# Patient Record
Sex: Male | Born: 1985 | Race: White | Hispanic: No | Marital: Single | Smoking: Former smoker
Health system: Southern US, Community
[De-identification: ages and names within clinical notes are randomized; demographics above are authoritative.]

## PROBLEM LIST (undated history)

## (undated) DIAGNOSIS — G4730 Sleep apnea, unspecified: Secondary | ICD-10-CM

## (undated) DIAGNOSIS — S069X9A Unspecified intracranial injury with loss of consciousness of unspecified duration, initial encounter: Secondary | ICD-10-CM

## (undated) DIAGNOSIS — S069XAA Unspecified intracranial injury with loss of consciousness status unknown, initial encounter: Secondary | ICD-10-CM

---

## 2018-11-11 ENCOUNTER — Emergency Department (HOSPITAL_COMMUNITY)
Admission: EM | Admit: 2018-11-11 | Discharge: 2018-11-14 | Disposition: A | Discharge status: Other Acute Inpatient Hospital | Payer: BLUE CROSS/BLUE SHIELD | Attending: Emergency Medicine | Admitting: Emergency Medicine

## 2018-11-11 ENCOUNTER — Other Ambulatory Visit: Payer: Self-pay

## 2018-11-11 DIAGNOSIS — F259 Schizoaffective disorder, unspecified: Secondary | ICD-10-CM | POA: Insufficient documentation

## 2018-11-11 DIAGNOSIS — F250 Schizoaffective disorder, bipolar type: Secondary | ICD-10-CM | POA: Diagnosis present

## 2018-11-11 DIAGNOSIS — Z79899 Other long term (current) drug therapy: Secondary | ICD-10-CM | POA: Insufficient documentation

## 2018-11-11 DIAGNOSIS — Z046 Encounter for general psychiatric examination, requested by authority: Secondary | ICD-10-CM | POA: Diagnosis not present

## 2018-11-11 DIAGNOSIS — F29 Unspecified psychosis not due to a substance or known physiological condition: Secondary | ICD-10-CM | POA: Diagnosis present

## 2018-11-11 HISTORY — DX: Sleep apnea, unspecified: G47.30

## 2018-11-11 HISTORY — DX: Unspecified intracranial injury with loss of consciousness status unknown, initial encounter: S06.9XAA

## 2018-11-11 HISTORY — DX: Unspecified intracranial injury with loss of consciousness of unspecified duration, initial encounter: S06.9X9A

## 2018-11-11 LAB — ACETAMINOPHEN LEVEL: Acetaminophen (Tylenol), Serum: <10 ug/mL — ABNORMAL LOW (ref 10–30)

## 2018-11-11 LAB — COMPREHENSIVE METABOLIC PANEL
ALT: 30 U/L (ref 0–44)
AST: 29 U/L (ref 15–41)
Albumin: 4.7 g/dL (ref 3.5–5.0)
Alkaline Phosphatase: 66 U/L (ref 38–126)
Anion gap: 10 (ref 5–15)
BUN: 9 mg/dL (ref 6–20)
CO2: 23 mmol/L (ref 22–32)
Calcium: 9.6 mg/dL (ref 8.9–10.3)
Chloride: 105 mmol/L (ref 98–111)
Creatinine, Ser: 0.83 mg/dL (ref 0.61–1.24)
GFR calc non Af Amer: >60 mL/min (ref >60)
Glucose, Bld: 114 mg/dL — ABNORMAL HIGH (ref 70–99)
Potassium: 3.5 mmol/L (ref 3.5–5.1)
Sodium: 138 mmol/L (ref 135–145)
Total Bilirubin: 0.5 mg/dL (ref 0.3–1.2)
Total Protein: 7.4 g/dL (ref 6.5–8.1)

## 2018-11-11 LAB — CBC
HCT: 45.6 % (ref 39.0–52.0)
Hemoglobin: 15 g/dL (ref 13.0–17.0)
MCH: 29.5 pg (ref 26.0–34.0)
MCHC: 32.9 g/dL (ref 30.0–36.0)
MCV: 89.6 fL (ref 80.0–100.0)
PLATELETS: 182 10*3/uL (ref 150–400)
RBC: 5.09 MIL/uL (ref 4.22–5.81)
RDW: 13.3 % (ref 11.5–15.5)
WBC: 8.6 10*3/uL (ref 4.0–10.5)
nRBC: 0 % (ref 0.0–0.2)

## 2018-11-11 LAB — ETHANOL: Alcohol, Ethyl (B): <10 mg/dL (ref <10)

## 2018-11-11 LAB — RAPID URINE DRUG SCREEN, HOSP PERFORMED
Amphetamines: NOT DETECTED
Barbiturates: NOT DETECTED
Benzodiazepines: NOT DETECTED
Cocaine: NOT DETECTED
Opiates: NOT DETECTED
Tetrahydrocannabinol: NOT DETECTED

## 2018-11-11 LAB — SALICYLATE LEVEL: Salicylate Lvl: <7 mg/dL (ref 2.8–30.0)

## 2018-11-11 NOTE — ED Notes (Signed)
Spoke with [facility] at [facility] possible placement  Bed available tonight.
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
## 2018-11-11 NOTE — ED Notes (Signed)
Contact numbers for parents Akbar Sparaco [phone]. Several attempts to contact TTS at multiple numbers unsuccessful.

## 2018-11-11 NOTE — Progress Notes (Signed)
Per Donell Sievert, PA pt is recommended for inpt treatment. EDP Fawze, Mina A, PA-C has been advised of the disposition and is in agreement.   [facility] to review for possible admission.  Princess Bruins, MSW, LCSW Therapeutic Triage Specialist  [phone]

## 2018-11-11 NOTE — Progress Notes (Signed)
TTS waiting on IVC to be faxed to [phone] in order to conduct complete assessment. Reita Cliche, RN is aware.  Princess Bruins, MSW, LCSW Therapeutic Triage Specialist  [phone]
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
## 2018-11-12 DIAGNOSIS — F250 Schizoaffective disorder, bipolar type: Secondary | ICD-10-CM

## 2018-11-12 LAB — CBG MONITORING, ED: Glucose-Capillary: 119 mg/dL — ABNORMAL HIGH (ref 70–99)

## 2018-11-12 MED ORDER — CARIPRAZINE HCL 1.5 MG PO CAPS
1.5000 mg | ORAL_CAPSULE | Freq: Every day | ORAL | Status: DC
  Administered 2018-11-12 – 2018-11-14 (×3): 1.5 mg via ORAL
  Filled 2018-11-12 (×3): qty 1

## 2018-11-12 MED ORDER — OXCARBAZEPINE 300 MG PO TABS
600.0000 mg | ORAL_TABLET | Freq: Two times a day (BID) | ORAL | Status: DC
  Administered 2018-11-12 – 2018-11-14 (×4): 600 mg via ORAL
  Filled 2018-11-12 (×4): qty 2

## 2018-11-12 MED ORDER — ARIPIPRAZOLE 10 MG PO TABS: 10.0000 mg | ORAL_TABLET | Freq: Every evening | ORAL | Status: DC

## 2018-11-12 MED ORDER — LURASIDONE HCL 40 MG PO TABS
80.0000 mg | ORAL_TABLET | Freq: Every morning | ORAL | Status: DC
  Administered 2018-11-12: 80 mg via ORAL
  Filled 2018-11-12: qty 2

## 2018-11-12 MED ORDER — OXCARBAZEPINE 300 MG PO TABS
300.0000 mg | ORAL_TABLET | Freq: Two times a day (BID) | ORAL | Status: DC
  Administered 2018-11-12: 300 mg via ORAL
  Filled 2018-11-12 (×2): qty 1

## 2018-11-12 MED ORDER — ONDANSETRON HCL 4 MG PO TABS
4.0000 mg | ORAL_TABLET | Freq: Three times a day (TID) | ORAL | Status: DC | PRN
  Administered 2018-11-14: 4 mg via ORAL
  Filled 2018-11-12: qty 1

## 2018-11-12 MED ORDER — ACETAMINOPHEN 325 MG PO TABS: 650.0000 mg | ORAL_TABLET | ORAL | Status: DC | PRN

## 2018-11-12 NOTE — ED Notes (Signed)
Patient's mother asked to visit.  When patient was asked if he wanted his mother to visit he initially said "no" in a loud voice, but then said that he would like for her to visit.  Patient counseled that no yelling or loud voices would be allowed on the unit and he would have to visit with his mother quietly.  Patient agreed.

## 2018-11-12 NOTE — ED Notes (Signed)
Patient drinking hot decaf coffee so temperature was not obtained at this time.

## 2018-11-12 NOTE — ED Notes (Signed)
Patient up to use the phone. 

## 2018-11-12 NOTE — ED Notes (Signed)
Patient made aware during medication administration that his latuda had been discontinued and a new medication vraylar started.  Patient took medication saying, "I hope this doesn't hurt me."  Reassurance given to patient, then he replied, "that's what they all say."

## 2018-11-12 NOTE — ED Notes (Signed)
Patient alert, oriented, and cooperative taking medications without difficulty.

## 2018-11-12 NOTE — ED Notes (Signed)
Pt refused vital signs.

## 2018-11-12 NOTE — Progress Notes (Signed)
Received Yianni at the change of shift, in his bed and asleep in his room. He was cooperative with VS check and recheck, but remains very irritable. He slept throughout the night without incident.

## 2018-11-12 NOTE — ED Notes (Signed)
Patient cooperative with medication earlier, but refused vital signs when tech tried to take them.  Patient irritable when psychiatric team made their rounds.  "I want to go home."  Nurse practitioner reminded patient that he was IVC'd and would have to be admitted to a facility.  Patient remained physically calm, but became more irritable after that.

## 2018-11-12 NOTE — Progress Notes (Signed)
Received Blessed from TCU,he  went immediately to bed. He denied all of the psychiatric symptoms. He slept throughout the night without incident.
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
## 2018-11-13 ENCOUNTER — Encounter (HOSPITAL_COMMUNITY): Payer: Self-pay | Admitting: *Deleted

## 2018-11-13 DIAGNOSIS — F250 Schizoaffective disorder, bipolar type: Secondary | ICD-10-CM | POA: Diagnosis not present

## 2018-11-13 DIAGNOSIS — S069XAA Unspecified intracranial injury with loss of consciousness status unknown, initial encounter: Secondary | ICD-10-CM | POA: Insufficient documentation

## 2018-11-13 DIAGNOSIS — S069X9A Unspecified intracranial injury with loss of consciousness of unspecified duration, initial encounter: Secondary | ICD-10-CM | POA: Insufficient documentation

## 2018-11-13 NOTE — Progress Notes (Signed)
Admit to [facility] after 9 am to the [employer].  Call report to [phone], accepted by Dr Loyola Mast  Nanine Means, PMHNP

## 2018-11-13 NOTE — ED Notes (Addendum)
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

## 2018-11-13 NOTE — ED Notes (Signed)
On the phone 
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
## 2018-11-13 NOTE — ED Notes (Signed)
Pt is aware of pending transfer tomorrow , contact information given.

## 2018-11-13 NOTE — ED Notes (Signed)
Dr Lenore Cordia and Catha Nottingham DNP into see.  When asked about showering pt responded "no and I'm not going to."

## 2018-11-13 NOTE — Progress Notes (Signed)
Per Psychiatrist Akintayo and DNP Lord, patient meets inpatient psychiatric hospitalization criteria. CSW faxed patient's referral to the following facilities:  [facility]  [facility]   CSW will continue to seek placement for patient.  Celso SickleKimberly Canyon Lohr, LCSW Clinical Social Worker [facility] Cell#: [phone]

## 2018-11-13 NOTE — ED Notes (Signed)
Pt's dad into see 

## 2018-11-13 NOTE — ED Notes (Signed)
Watching tv, eating snack.  Pt confirmed that he does use CPAP at night
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
## 2018-11-13 NOTE — ED Notes (Signed)
Up to the bathroom 

## 2018-11-13 NOTE — ED Notes (Signed)
Pt A&O x 3, no distress noted, calm & cooperative, watching TV at present.  Monitoring for safety, Q 15 min checks in effect.  Pending [facility] after [employer].  Pt is IVC.
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
## 2018-11-14 NOTE — ED Notes (Signed)
Patient is alert and oriented to person, place and time.  Presents with calm affect and mood.  Denies suicidal thoughts, auditory and visual hallucinations.  Medications given as prescribed.  Routine safety checks maintained every 15 minutes and via security camera.  Patient remained safe on the unit.

## 2019-08-08 ENCOUNTER — Emergency Department (HOSPITAL_COMMUNITY): Payer: BC Managed Care – PPO

## 2019-08-08 ENCOUNTER — Encounter (HOSPITAL_COMMUNITY): Payer: Self-pay | Admitting: Emergency Medicine

## 2019-08-08 ENCOUNTER — Emergency Department (HOSPITAL_COMMUNITY)
Admission: EM | Admit: 2019-08-08 | Discharge: 2019-08-09 | Disposition: A | Discharge status: Home / Self Care | Payer: BC Managed Care – PPO | Attending: Emergency Medicine | Admitting: Emergency Medicine

## 2019-08-08 ENCOUNTER — Other Ambulatory Visit: Payer: Self-pay

## 2019-08-08 DIAGNOSIS — Z23 Encounter for immunization: Secondary | ICD-10-CM | POA: Insufficient documentation

## 2019-08-08 DIAGNOSIS — M25512 Pain in left shoulder: Secondary | ICD-10-CM | POA: Insufficient documentation

## 2019-08-08 DIAGNOSIS — Y999 Unspecified external cause status: Secondary | ICD-10-CM | POA: Insufficient documentation

## 2019-08-08 DIAGNOSIS — S0101XA Laceration without foreign body of scalp, initial encounter: Secondary | ICD-10-CM | POA: Diagnosis not present

## 2019-08-08 DIAGNOSIS — Y929 Unspecified place or not applicable: Secondary | ICD-10-CM | POA: Insufficient documentation

## 2019-08-08 DIAGNOSIS — Z79899 Other long term (current) drug therapy: Secondary | ICD-10-CM | POA: Insufficient documentation

## 2019-08-08 DIAGNOSIS — Y9355 Activity, bike riding: Secondary | ICD-10-CM | POA: Insufficient documentation

## 2019-08-08 DIAGNOSIS — S42022A Displaced fracture of shaft of left clavicle, initial encounter for closed fracture: Secondary | ICD-10-CM

## 2019-08-08 MED ORDER — LIDOCAINE-EPINEPHRINE (PF) 2 %-1:200000 IJ SOLN
10.0000 mL | Freq: Once | INTRAMUSCULAR | Status: AC
  Administered 2019-08-08: 10 mL
  Filled 2019-08-08: qty 10

## 2019-08-08 MED ORDER — TETANUS-DIPHTH-ACELL PERTUSSIS 5-2.5-18.5 LF-MCG/0.5 IM SUSP
0.5000 mL | Freq: Once | INTRAMUSCULAR | Status: AC
  Administered 2019-08-08: 0.5 mL via INTRAMUSCULAR
  Filled 2019-08-08: qty 0.5

## 2019-08-08 MED ORDER — HYDROMORPHONE HCL 2 MG PO TABS: 1.0000 mg | ORAL_TABLET | Freq: Four times a day (QID) | ORAL | 0 refills | Status: AC | PRN

## 2019-08-08 MED ORDER — HYDROMORPHONE HCL 2 MG PO TABS
1.0000 mg | ORAL_TABLET | Freq: Once | ORAL | Status: AC
  Administered 2019-08-08: 1 mg via ORAL
  Filled 2019-08-08: qty 1
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
## 2019-08-08 NOTE — ED Notes (Addendum)
AUNT - Daniel Meza PRESENT PT Daniel Meza MOTHER Daniel Meza HAS GUARDIAN/ HCPOA MOTHER  IS THREE HOURS AWAY.  PT HAS AUTISM.
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
## 2019-08-08 NOTE — Discharge Instructions (Addendum)
Recommend return to ER or your primary care doctor in 7 days to have the staples removed.  Please call the orthopedic office to schedule follow-up appointment regarding her clavicle fracture today.  Please use sling as instructed.  Recommend no weightbearing in your left arm.  Please keep the scalp wound clean and dry.  May run gentle water but no scrubbing.  If you develop numbness, weakness, tingling, swelling in your left arm, please return to ER for recheck.  Take the prescribed medication as needed for pain control.  I additionally recommend using Tylenol Motrin for pain control.

## 2019-08-09 ENCOUNTER — Ambulatory Visit: Payer: BC Managed Care – PPO | Admitting: Orthopaedic Surgery

## 2019-08-09 DIAGNOSIS — S0101XD Laceration without foreign body of scalp, subsequent encounter: Secondary | ICD-10-CM

## 2019-08-09 DIAGNOSIS — S42022A Displaced fracture of shaft of left clavicle, initial encounter for closed fracture: Secondary | ICD-10-CM | POA: Diagnosis not present
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
## 2019-08-10 DIAGNOSIS — S0101XA Laceration without foreign body of scalp, initial encounter: Secondary | ICD-10-CM | POA: Insufficient documentation

## 2019-08-10 DIAGNOSIS — S42022A Displaced fracture of shaft of left clavicle, initial encounter for closed fracture: Secondary | ICD-10-CM | POA: Insufficient documentation

## 2019-08-16 ENCOUNTER — Ambulatory Visit (INDEPENDENT_AMBULATORY_CARE_PROVIDER_SITE_OTHER): Payer: BC Managed Care – PPO | Admitting: Orthopaedic Surgery

## 2019-08-16 ENCOUNTER — Telehealth: Payer: Self-pay | Admitting: Orthopaedic Surgery

## 2019-08-16 ENCOUNTER — Other Ambulatory Visit: Payer: Self-pay

## 2019-08-16 DIAGNOSIS — S42022A Displaced fracture of shaft of left clavicle, initial encounter for closed fracture: Secondary | ICD-10-CM | POA: Diagnosis not present

## 2019-08-16 NOTE — Telephone Encounter (Signed)
Patient's mother called this morning stating that the patient's collar bone looks significantly worse and wanted to get in to see Dr. Lorin Mercy today.  CB#[phone].  Thank you.

## 2019-08-16 NOTE — Telephone Encounter (Signed)
I called patient's mother and made work in appt for 1100 today. She will have aunt bring him.

## 2019-08-16 NOTE — Telephone Encounter (Signed)
yates 

## 2019-08-17 MED ORDER — SODIUM CHLORIDE (PF) 0.9 % IJ SOLN
INTRAMUSCULAR | Status: AC
  Filled 2019-08-17: qty 50

## 2019-08-18 NOTE — Progress Notes (Signed)
   Office Visit Note   Patient: Daniel Meza           Date of Birth: 07/10/1986           MRN: [id number] Visit Date: 08/16/2019              Requested by: No referring provider defined for this encounter. PCP: Patient, No Pcp Per   Assessment & Plan: Visit Diagnoses:  1. Displaced fracture of shaft of left clavicle, initial encounter for closed fracture     Plan: Return next week.  Staples removal from his left scalp.  Repeat x-rays of his clavicle, left.  Continued intermittent ice he can use ibuprofen for pain and avoid the narcotic.  Follow-Up Instructions: No follow-ups on file.   Orders:  No orders of the defined types were placed in this encounter.  No orders of the defined types were placed in this encounter.     Procedures: No procedures performed   Clinical Data: No additional findings.   Subjective: Chief Complaint  Patient presents with  . Left Shoulder - Follow-up    HPI 33 year old male returns early for follow-up of left clavicle fracture 1 week out.  Patient is here with his aunt who is with him with his previous visit.  Patient's mother felt that the clavicle looked more prominent she noted that the patient had been removing his sling rather frequently.  Patient has bipolar disorder schizoaffective disorder.  He states he really has not had much pain with it pain down and he has not taken any narcotic medicine in over 24hours.  He still has staples in his scalp which will be removed on next visit.  Patient denies any numbness or tingling in his fingers.  Fracture was closed mid clavicle.  Review of Systems updated and unchanged.   Objective: Vital Signs: There were no vitals taken for this visit.  Physical Exam General physical exam is unchanged other than orthopedic exam see below.  Ortho Exam patient has good cervical range of motion normal station his hand no significant swelling in his fingers good flexion-extension.  He is using his left hand  to work his phone all different objects as well.  Slight ecchymosis anteriorly over the clavicle.  Midshaft clavicle prominence at the fracture site somewhere the last week.  Patient is in a sling.  Sensation over the deltoid laterally is intact median radial ulnar sensation is intact as well as motor.  Specialty Comments:  No specialty comments available.  Imaging: No results found.   PMFS History: Patient Active Problem List   Diagnosis Date Noted  . Scalp laceration 08/10/2019  . Displaced fracture of shaft of left clavicle, initial encounter for closed fracture 08/10/2019  . TBI (traumatic brain injury) ([place])   . Schizoaffective disorder, bipolar type ([place]) 11/12/2018   Past Medical History:  Diagnosis Date  . Sleep apnea    uses CPAP  . TBI (traumatic brain injury) ([place])     No family history on file.  No past surgical history on file. Social History   Occupational History  . Not on file  Tobacco Use  . Smoking status: Not on file  Substance and Sexual Activity  . Alcohol use: Not on file  . Drug use: Not on file  . Sexual activity: Not on file

## 2019-08-23 ENCOUNTER — Ambulatory Visit: Payer: Self-pay

## 2019-08-23 ENCOUNTER — Other Ambulatory Visit: Payer: Self-pay

## 2019-08-23 ENCOUNTER — Ambulatory Visit (INDEPENDENT_AMBULATORY_CARE_PROVIDER_SITE_OTHER): Payer: BC Managed Care – PPO | Admitting: Orthopaedic Surgery

## 2019-08-23 DIAGNOSIS — S42022A Displaced fracture of shaft of left clavicle, initial encounter for closed fracture: Secondary | ICD-10-CM | POA: Diagnosis not present

## 2019-08-23 NOTE — Progress Notes (Signed)
Office Visit Note   Patient: Daniel Meza           Date of Birth: 21-Sep-1986           MRN: [id number] Visit Date: 08/23/2019              Requested by: No referring provider defined for this encounter. PCP: Patient, No Pcp Per   Assessment & Plan: Visit Diagnoses:  1. Displaced fracture of shaft of left clavicle, initial encounter for closed fracture     Plan: Patient taking Tylenol for pain.  A sleepy today frequently closing his eyes.  Mother is with him today.  I will recheck him in 4 weeks repeat x-rays at that time left clavicle.  If patient is not starting to make some bone we could consider open reduction internal fixation and plate fixation.    Staples left scalp are removed today.  Follow-Up Instructions: Return in about 4 weeks (around 09/20/2019).   Orders:  Orders Placed This Encounter  Procedures  . XR Clavicle Left   No orders of the defined types were placed in this encounter.     Procedures: No procedures performed   Clinical Data: No additional findings.   Subjective: Chief Complaint  Patient presents with  . Left Shoulder - Fracture, Follow-up    Left Clavicle Fx- DOI 08/08/2019    HPI 33 year old male returns he is here with his mother.  He is sleepy today frequently closing his eyes his mother states she thinks it may be related to narcolepsy.  Patient's is using Tylenol for pain not taking any narcotic medication and states his clavicle fracture gives him minimal discomfort.  He has been wearing a sling.  No swelling of the hand good motion of his fingers.  Scalp staples are still in left side of the head without problems.  Review of Systems positive for diagnosis schizoaffective disorder bipolar type.  He denies hearing voices or hallucinations.   Objective: Vital Signs: There were no vitals taken for this visit.  Physical Exam Constitutional:      Appearance: He is well-developed.  HENT:     Head: Normocephalic and atraumatic.  Eyes:      Pupils: Pupils are equal, round, and reactive to light.  Neck:     Thyroid: No thyromegaly.     Trachea: No tracheal deviation.  Cardiovascular:     Rate and Rhythm: Normal rate.  Pulmonary:     Effort: Pulmonary effort is normal.     Breath sounds: No wheezing.  Abdominal:     General: Bowel sounds are normal.     Palpations: Abdomen is soft.  Skin:    General: Skin is warm and dry.     Capillary Refill: Capillary refill takes less than 2 seconds.  Neurological:     Mental Status: He is alert and oriented to person, place, and time.  Psychiatric:        Behavior: Behavior normal.        Thought Content: Thought content normal.        Judgment: Judgment normal.     Ortho Exam sensation intact in the hand.  Patient is wearing a sling appropriately.  Specialty Comments:  No specialty comments available.  Imaging: No results found.   PMFS History: Patient Active Problem List   Diagnosis Date Noted  . Scalp laceration 08/10/2019  . Displaced fracture of shaft of left clavicle, initial encounter for closed fracture 08/10/2019  . TBI (traumatic brain injury) (HCC)   .  Schizoaffective disorder, bipolar type ([place]) 11/12/2018   Past Medical History:  Diagnosis Date  . Sleep apnea    uses CPAP  . TBI (traumatic brain injury) ([place])     No family history on file.  No past surgical history on file. Social History   Occupational History  . Not on file  Tobacco Use  . Smoking status: Not on file  Substance and Sexual Activity  . Alcohol use: Not on file  . Drug use: Not on file  . Sexual activity: Not on file

## 2019-09-26 ENCOUNTER — Ambulatory Visit: Payer: Self-pay

## 2019-09-26 ENCOUNTER — Other Ambulatory Visit: Payer: Self-pay

## 2019-09-26 ENCOUNTER — Ambulatory Visit (INDEPENDENT_AMBULATORY_CARE_PROVIDER_SITE_OTHER): Payer: BC Managed Care – PPO | Admitting: Orthopaedic Surgery

## 2019-09-26 DIAGNOSIS — S42022A Displaced fracture of shaft of left clavicle, initial encounter for closed fracture: Secondary | ICD-10-CM

## 2019-09-26 NOTE — Progress Notes (Signed)
Office Visit Note   Patient: Daniel Meza           Date of Birth: 1986/05/18           MRN: [id number] Visit Date: 09/26/2019              Requested by: No referring provider defined for this encounter. PCP: Patient, No Pcp Per   Assessment & Plan: Visit Diagnoses:  1. Displaced fracture of shaft of left clavicle, initial encounter for closed fracture     Plan: We discussed avoiding heavy pulling or pushing with the left upper extremity.  Return in 8 weeks for final x-rays of the clavicle.  Follow-Up Instructions: Return in about 8 weeks (around 11/21/2019).   Orders:  Orders Placed This Encounter  Procedures  . XR Clavicle Left   No orders of the defined types were placed in this encounter.     Procedures: No procedures performed   Clinical Data: No additional findings.   Subjective: Chief Complaint  Patient presents with  . Left Shoulder - Fracture, Follow-up    DOI 08/08/2019    HPI 33 year old male returns he is here with his mother for follow-up of left midshaft clavicle fracture date of injury 08/08/2019.  Patient states is not giving him any pain he can get his arm up over his head no problems with dressing or bathing.  He states he still has some pain in his upper body since the accident but not at the clavicle.  Past history of TBI.  Patient states mother wanted him to get a second opinion he went to [facility] and patient states they told him the same thing and discussed the same things that we had discussed with him.  Review of Systems updated unchanged noncontributory to HPI.   Objective: Vital Signs: There were no vitals taken for this visit.  Physical Exam Constitutional:      Appearance: He is well-developed.  HENT:     Head: Normocephalic and atraumatic.  Eyes:     Pupils: Pupils are equal, round, and reactive to light.  Neck:     Thyroid: No thyromegaly.     Trachea: No tracheal deviation.  Cardiovascular:     Rate and Rhythm: Normal rate.    Pulmonary:     Effort: Pulmonary effort is normal.     Breath sounds: No wheezing.  Abdominal:     General: Bowel sounds are normal.     Palpations: Abdomen is soft.  Skin:    General: Skin is warm and dry.     Capillary Refill: Capillary refill takes less than 2 seconds.  Neurological:     Mental Status: He is alert and oriented to person, place, and time.  Psychiatric:        Behavior: Behavior normal.        Thought Content: Thought content normal.        Judgment: Judgment normal.     Ortho Exam no swelling to the hand.  There is some prominence with bump at the midshaft clavicle region on the left.  With pressure on each end of the bone it appears to move in 1unit.  Skin is intact.  Specialty Comments:  No specialty comments available.  Imaging: No results found.   PMFS History: Patient Active Problem List   Diagnosis Date Noted  . Scalp laceration 08/10/2019  . Displaced fracture of shaft of left clavicle, initial encounter for closed fracture 08/10/2019  . TBI (traumatic brain injury) (HCC)   .  Schizoaffective disorder, bipolar type ([place]) 11/12/2018   Past Medical History:  Diagnosis Date  . Sleep apnea    uses CPAP  . TBI (traumatic brain injury) ([place])     No family history on file.  No past surgical history on file. Social History   Occupational History  . Not on file  Tobacco Use  . Smoking status: Not on file  Substance and Sexual Activity  . Alcohol use: Not on file  . Drug use: Not on file  . Sexual activity: Not on file

## 2019-11-08 ENCOUNTER — Emergency Department (HOSPITAL_COMMUNITY)
Admission: EM | Admit: 2019-11-08 | Discharge: 2019-11-09 | Disposition: A | Discharge status: Home / Self Care | Payer: BC Managed Care – PPO | Attending: Emergency Medicine | Admitting: Emergency Medicine

## 2019-11-08 ENCOUNTER — Other Ambulatory Visit: Payer: Self-pay

## 2019-11-08 ENCOUNTER — Encounter (HOSPITAL_COMMUNITY): Payer: Self-pay | Admitting: Behavioral Health

## 2019-11-08 DIAGNOSIS — F250 Schizoaffective disorder, bipolar type: Secondary | ICD-10-CM | POA: Diagnosis present

## 2019-11-08 DIAGNOSIS — Z046 Encounter for general psychiatric examination, requested by authority: Secondary | ICD-10-CM | POA: Diagnosis present

## 2019-11-08 DIAGNOSIS — F840 Autistic disorder: Secondary | ICD-10-CM | POA: Insufficient documentation

## 2019-11-08 DIAGNOSIS — Z87820 Personal history of traumatic brain injury: Secondary | ICD-10-CM | POA: Insufficient documentation

## 2019-11-08 DIAGNOSIS — F259 Schizoaffective disorder, unspecified: Secondary | ICD-10-CM | POA: Insufficient documentation

## 2019-11-08 DIAGNOSIS — Z5320 Procedure and treatment not carried out because of patient's decision for unspecified reasons: Secondary | ICD-10-CM | POA: Diagnosis not present

## 2019-11-08 DIAGNOSIS — Z79899 Other long term (current) drug therapy: Secondary | ICD-10-CM | POA: Insufficient documentation

## 2019-11-08 DIAGNOSIS — Z20822 Contact with and (suspected) exposure to covid-19: Secondary | ICD-10-CM | POA: Diagnosis not present

## 2019-11-08 LAB — CBC WITH DIFFERENTIAL/PLATELET
Abs Immature Granulocytes: 0.07 10*3/uL (ref 0.00–0.07)
Basophils Absolute: 0.2 10*3/uL — ABNORMAL HIGH (ref 0.0–0.1)
Basophils Relative: 1 %
Eosinophils Absolute: 0.5 10*3/uL (ref 0.0–0.5)
Eosinophils Relative: 4 %
HCT: 48 % (ref 39.0–52.0)
Hemoglobin: 16.1 g/dL (ref 13.0–17.0)
Immature Granulocytes: 1 %
Lymphocytes Relative: 24 %
Lymphs Abs: 2.6 10*3/uL (ref 0.7–4.0)
MCH: 29.5 pg (ref 26.0–34.0)
MCHC: 33.5 g/dL (ref 30.0–36.0)
MCV: 87.9 fL (ref 80.0–100.0)
Monocytes Absolute: 0.8 10*3/uL (ref 0.1–1.0)
Monocytes Relative: 7 %
Neutro Abs: 6.7 10*3/uL (ref 1.7–7.7)
Neutrophils Relative %: 63 %
Platelets: 234 10*3/uL (ref 150–400)
RBC: 5.46 MIL/uL (ref 4.22–5.81)
RDW: 12.8 % (ref 11.5–15.5)
WBC: 10.8 10*3/uL — ABNORMAL HIGH (ref 4.0–10.5)
nRBC: 0 % (ref 0.0–0.2)

## 2019-11-08 LAB — RESPIRATORY PANEL BY RT PCR (FLU A&B, COVID)
Influenza A by PCR: NEGATIVE
Influenza B by PCR: NEGATIVE
SARS Coronavirus 2 by RT PCR: NEGATIVE

## 2019-11-08 LAB — COMPREHENSIVE METABOLIC PANEL
ALT: 52 U/L — ABNORMAL HIGH (ref 0–44)
AST: 42 U/L — ABNORMAL HIGH (ref 15–41)
Albumin: 4.9 g/dL (ref 3.5–5.0)
Alkaline Phosphatase: 86 U/L (ref 38–126)
Anion gap: 11 (ref 5–15)
BUN: 10 mg/dL (ref 6–20)
CO2: 26 mmol/L (ref 22–32)
Calcium: 10 mg/dL (ref 8.9–10.3)
Chloride: 98 mmol/L (ref 98–111)
Creatinine, Ser: 0.71 mg/dL (ref 0.61–1.24)
GFR calc Af Amer: >60 mL/min (ref >60)
GFR calc non Af Amer: >60 mL/min (ref >60)
Glucose, Bld: 107 mg/dL — ABNORMAL HIGH (ref 70–99)
Potassium: 4.2 mmol/L (ref 3.5–5.1)
Sodium: 135 mmol/L (ref 135–145)
Total Bilirubin: 0.5 mg/dL (ref 0.3–1.2)
Total Protein: 8 g/dL (ref 6.5–8.1)

## 2019-11-08 LAB — RAPID URINE DRUG SCREEN, HOSP PERFORMED
Amphetamines: NOT DETECTED
Barbiturates: NOT DETECTED
Benzodiazepines: NOT DETECTED
Cocaine: NOT DETECTED
Opiates: NOT DETECTED
Tetrahydrocannabinol: NOT DETECTED

## 2019-11-08 LAB — ETHANOL: Alcohol, Ethyl (B): <10 mg/dL (ref <10)

## 2019-11-08 MED ORDER — HYDROXYZINE HCL 25 MG PO TABS: 50.0000 mg | ORAL_TABLET | Freq: Four times a day (QID) | ORAL | Status: DC | PRN

## 2019-11-08 MED ORDER — LORAZEPAM 2 MG/ML IJ SOLN: 1.0000 mg | Freq: Four times a day (QID) | INTRAMUSCULAR | Status: DC | PRN

## 2019-11-08 NOTE — ED Triage Notes (Signed)
IVC states patient is having psychosis and off meds.   IVC paperwork states that he is having S/I and H/I.  Patient is however denying all of these things.  He also denies AVH and states he is taking his medication.
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
## 2019-11-08 NOTE — Progress Notes (Signed)
Received Oak this PM in his room asleep with the sitter at the bedside.He was awaken for VS and very irritable. He denied the need for a CPAP machine to aid his sleep. He slept without incident throughout the night.

## 2019-11-08 NOTE — BH Assessment (Addendum)
[facility] Assessment Progress Note  Per Shuvon Rankin, FNP, this pt requires psychiatric hospitalization at this time.  Pt presents under IVC initiated by pt's father/legal guardian and upheld by EDP Benjiman Core, MD.  Pt is currently under consideration for admission at [facility] with decision pending.  In the meantime, the following facilities have been contacted to seek placement for this pt, with results as noted:  Beds available, information sent, decision pending: [facility]  Declined: [facility] (autism diagnosis is exclusionary)  At capacity: [facility]  Pt is under the guardianship of his parents, Genevie Cheshire and Kou Gucciardo [phone]).  A copy of the letter of guardianship may be found in pt's EPIC record under the Media tab.  At 16:13 I called them and informed them of pt's current disposition. I also informed them that pt had been declined by [facility], the first choice of both them and pt's outpatient provider, Tamela Oddi.  I informed them that [facility] staff will keep them informed of pt's disposition and that a copy of the letter of guardianship will follow the pt to any facility that agrees to accept him.  They report that pt has been refusing to take medications.  They and Tamela Oddi have reportedly been considering starting pt on an injectable medication, but pt has so far refused to cooperate with this also.  Please note that the parents have been appointed as limited general guardians.  Refer to the letter of guardianship to ascertain rights retained by the pt versus those granted to the parents.   Doylene Canning, [place] [facility] Coordinator [phone]
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
## 2019-11-09 NOTE — Consult Note (Signed)
Telepsych Consultation   Reason for Consult: Delusional and aggressive behavior  Referring Physician:  Wonda Olds EDP Location of Patient:  Location of Provider: [facility]  Patient Identification: Gregrey Bloyd MRN:  [id number] Principal Diagnosis: Schizoaffective disorder, bipolar type (HCC) Diagnosis:  Principal Problem:   Schizoaffective disorder, bipolar type (HCC)   Total Time spent with patient: 30 minutes  Subjective:   Kishaun Erekson is a 34 y.o. male patient.  Patient assessed by nurse practitioner.  Patient alert and oriented.  Patient states "my parents have a problem with me being on this planet, they follow me around, I wanted a therapist but they gave me a psychiatrist who gave me a bunch of pills that do not work in my body correctly." Patient appears irritable and labile, patient states "I feel like my parents are trying to sell me to some Corporation, they are illegal drug dealers, I used to be there sleep in their home, they have medical power of attorney." Patient visualized with physically aggressive behavior when notified he would need to remain in hospital for a few days.  Patient visualized shoving telepsychiatry machine toward nurse.  HPI: Patient admitted under involuntary commitment, petition states patient has increased delusions and threatening family members.  Past Psychiatric History: Schizoaffective disorder, bipolar type. TBI  Risk to Self: Suicidal Ideation: No Suicidal Intent: No Is patient at risk for suicide?: No Suicidal Plan?: No Access to Means: No What has been your use of drugs/alcohol within the last 12 months?: Alcohol Risk to Others: Homicidal Ideation: No Thoughts of Harm to Others: Yes-Currently Present Comment - Thoughts of Harm to Others: Pt told parents of desire to harm them Current Homicidal Intent: No Current Homicidal Plan: No Access to Homicidal Means: No Identified Victim: Father, grandfather, mother History of  harm to others?: No Assessment of Violence: None Noted Does patient have access to weapons?: No Criminal Charges Pending?: No Does patient have a court date: No Prior Inpatient Therapy: Prior Inpatient Therapy: Yes Prior Therapy Dates: Feb 2020 and other Prior Therapy Facilty/Provider(s): [facility] and other Reason for Treatment: Schizoaffective Disorder Prior Outpatient Therapy: Prior Outpatient Therapy: Yes Prior Therapy Dates: Ongoing Prior Therapy Facilty/Provider(s): [facility] Reason for Treatment: Schizoaffective D/O Does patient have an ACCT team?: No Does patient have Intensive In-House Services?  : No Does patient have [facility] services? : No Does patient have P4CC services?: No  Past Medical History:  Past Medical History:  Diagnosis Date  . Sleep apnea    uses CPAP  . TBI (traumatic brain injury) (HCC)    No past surgical history on file. Family History: No family history on file. Family Psychiatric  History: Unknown Social History:  Social History   Substance and Sexual Activity  Alcohol Use Yes   Comment: Not sure     Social History   Substance and Sexual Activity  Drug Use Not on file    Social History   Socioeconomic History  . Marital status: Single    Spouse name: Not on file  . Number of children: Not on file  . Years of education: Not on file  . Highest education level: Not on file  Occupational History  . Occupation: Disability  Tobacco Use  . Smoking status: Not on file  Substance and Sexual Activity  . Alcohol use: Yes    Comment: Not sure  . Drug use: Not on file  . Sexual activity: Never  Other Topics Concern  . Not on file  Social History Narrative  Pt lives in a home owned by his parents, [place] and [place].  Pt receives outpatient treatment with Tamela Oddi at [facility].  He is on disability.   Social Determinants of Health   Financial Resource Strain:   . Difficulty of Paying Living Expenses: Not on file  Food Insecurity:    . Worried About Programme researcher, broadcasting/film/video in the Last Year: Not on file  . Ran Out of Food in the Last Year: Not on file  Transportation Needs:   . Lack of Transportation (Medical): Not on file  . Lack of Transportation (Non-Medical): Not on file  Physical Activity:   . Days of Exercise per Week: Not on file  . Minutes of Exercise per Session: Not on file  Stress:   . Feeling of Stress : Not on file  Social Connections:   . Frequency of Communication with Friends and Family: Not on file  . Frequency of Social Gatherings with Friends and Family: Not on file  . Attends Religious Services: Not on file  . Active Member of Clubs or Organizations: Not on file  . Attends Banker Meetings: Not on file  . Marital Status: Not on file   Additional Social History:    Allergies:   Allergies  Allergen Reactions  . Geodon [Ziprasidone Hcl] Other (See Comments)    Makes comatose  . Penicillins Anaphylaxis and Rash    Did it involve swelling of the face/tongue/throat, SOB, or low BP? Yes Did it involve sudden or severe rash/hives, skin peeling, or any reaction on the inside of your mouth or nose? Yes Did you need to seek medical attention at a hospital or doctor's office? Yes When did it last happen?prior to 2010 If all above answers are "NO", may proceed with cephalosporin use.   . Haldol [Haloperidol Lactate] Other (See Comments)    dyskinesia  . Latuda [Lurasidone] Nausea And Vomiting  . Percocet [Oxycodone-Acetaminophen] Other (See Comments)    Dizzy, hallucinations  . Risperidone And Related Nausea And Vomiting    Labs:  Results for orders placed or performed during the hospital encounter of 11/08/19 (from the past 48 hour(s))  Urine rapid drug screen (hosp performed)     Status: None   Collection Time: 11/08/19 11:44 AM  Result Value Ref Range   Opiates NONE DETECTED NONE DETECTED   Cocaine NONE DETECTED NONE DETECTED   Benzodiazepines NONE DETECTED NONE DETECTED    Amphetamines NONE DETECTED NONE DETECTED   Tetrahydrocannabinol NONE DETECTED NONE DETECTED   Barbiturates NONE DETECTED NONE DETECTED    Comment: (NOTE) DRUG SCREEN FOR MEDICAL PURPOSES ONLY.  IF CONFIRMATION IS NEEDED FOR ANY PURPOSE, NOTIFY LAB WITHIN 5 DAYS. LOWEST DETECTABLE LIMITS FOR URINE DRUG SCREEN Drug Class                     Cutoff (ng/mL) Amphetamine and metabolites    1000 Barbiturate and metabolites    200 Benzodiazepine                 200 Tricyclics and metabolites     300 Opiates and metabolites        300 Cocaine and metabolites        300 THC                            50 Performed at [facility], [address] [place], [place] [address]   Comprehensive  metabolic panel     Status: Abnormal   Collection Time: 11/08/19 12:02 PM  Result Value Ref Range   Sodium 135 135 - 145 mmol/L   Potassium 4.2 3.5 - 5.1 mmol/L   Chloride 98 98 - 111 mmol/L   CO2 26 22 - 32 mmol/L   Glucose, Bld 107 (H) 70 - 99 mg/dL   BUN 10 6 - 20 mg/dL   Creatinine, Ser 0.71 0.61 - 1.24 mg/dL   Calcium 10.0 8.9 - 10.3 mg/dL   Total Protein 8.0 6.5 - 8.1 g/dL   Albumin 4.9 3.5 - 5.0 g/dL   AST 42 (H) 15 - 41 U/L   ALT 52 (H) 0 - 44 U/L   Alkaline Phosphatase 86 38 - 126 U/L   Total Bilirubin 0.5 0.3 - 1.2 mg/dL   GFR calc non Af Amer >60 >60 mL/min   GFR calc Af Amer >60 >60 mL/min   Anion gap 11 5 - 15    Comment: Performed at [facility], [place] [address] [place], [place] [address]  Ethanol     Status: None   Collection Time: 11/08/19 12:02 PM  Result Value Ref Range   Alcohol, Ethyl (B) <10 <10 mg/dL    Comment: (NOTE) Lowest detectable limit for serum alcohol is 10 mg/dL. For medical purposes only. Performed at [facility], [place] [address] [place], [place] [address]   CBC with Diff     Status: Abnormal   Collection Time: 11/08/19 12:02 PM  Result Value Ref Range   WBC 10.8 (H) 4.0 - 10.5 K/uL   RBC 5.46  4.22 - 5.81 MIL/uL   Hemoglobin 16.1 13.0 - 17.0 g/dL   HCT 48.0 39.0 - 52.0 %   MCV 87.9 80.0 - 100.0 fL   MCH 29.5 26.0 - 34.0 pg   MCHC 33.5 30.0 - 36.0 g/dL   RDW 12.8 11.5 - 15.5 %   Platelets 234 150 - 400 K/uL   nRBC 0.0 0.0 - 0.2 %   Neutrophils Relative % 63 %   Neutro Abs 6.7 1.7 - 7.7 K/uL   Lymphocytes Relative 24 %   Lymphs Abs 2.6 0.7 - 4.0 K/uL   Monocytes Relative 7 %   Monocytes Absolute 0.8 0.1 - 1.0 K/uL   Eosinophils Relative 4 %   Eosinophils Absolute 0.5 0.0 - 0.5 K/uL   Basophils Relative 1 %   Basophils Absolute 0.2 (H) 0.0 - 0.1 K/uL   Immature Granulocytes 1 %   Abs Immature Granulocytes 0.07 0.00 - 0.07 K/uL    Comment: Performed at [facility], [place] [address], [place], [place] [address]  Respiratory Panel by RT PCR (Flu A&B, Covid) - Nasopharyngeal Swab     Status: None   Collection Time: 11/08/19  1:30 PM   Specimen: Nasopharyngeal Swab  Result Value Ref Range   SARS Coronavirus 2 by RT PCR NEGATIVE NEGATIVE    Comment: (NOTE) SARS-CoV-2 target nucleic acids are NOT DETECTED. The SARS-CoV-2 RNA is generally detectable in upper respiratoy specimens during the acute phase of infection. The lowest concentration of SARS-CoV-2 viral copies this assay can detect is 131 copies/mL. A negative result does not preclude SARS-Cov-2 infection and should not be used as the sole basis for treatment or other patient management decisions. A negative result may occur with  improper specimen collection/handling, submission of specimen other than nasopharyngeal swab, presence of viral mutation(s) within the areas targeted by this assay, and inadequate  number of viral copies (<131 copies/mL). A negative result must be combined with clinical observations, patient history, and epidemiological information. The expected result is Negative. Fact Sheet for Patients:  https://www.moore.com/ Fact Sheet for Healthcare Providers:   https://www.young.biz/ This test is not yet ap proved or cleared by the [place] FDA and  has been authorized for detection and/or diagnosis of SARS-CoV-2 by FDA under an Emergency Use Authorization (EUA). This EUA will remain  in effect (meaning this test can be used) for the duration of the COVID-19 declaration under Section 564(b)(1) of the Act, 21 U.S.C. section 360bbb-3(b)(1), unless the authorization is terminated or revoked sooner.    Influenza A by PCR NEGATIVE NEGATIVE   Influenza B by PCR NEGATIVE NEGATIVE    Comment: (NOTE) The Xpert Xpress SARS-CoV-2/FLU/RSV assay is intended as an aid in  the diagnosis of influenza from Nasopharyngeal swab specimens and  should not be used as a sole basis for treatment. Nasal washings and  aspirates are unacceptable for Xpert Xpress SARS-CoV-2/FLU/RSV  testing. Fact Sheet for Patients: https://www.moore.com/ Fact Sheet for Healthcare Providers: https://www.young.biz/ This test is not yet approved or cleared by the [place] FDA and  has been authorized for detection and/or diagnosis of SARS-CoV-2 by  FDA under an Emergency Use Authorization (EUA). This EUA will remain  in effect (meaning this test can be used) for the duration of the  Covid-19 declaration under Section 564(b)(1) of the Act, 21  U.S.C. section 360bbb-3(b)(1), unless the authorization is  terminated or revoked. Performed at [facility], [address], [place], [place] [address]     Medications:  Current Facility-Administered Medications  Medication Dose Route Frequency Provider Last Rate Last Admin  . hydrOXYzine (ATARAX/VISTARIL) tablet 50 mg  50 mg Oral Q6H PRN Kendrick, Caitlyn S, PA-C      . LORazepam (ATIVAN) injection 1 mg  1 mg Intramuscular Q6H PRN Kendrick, Caitlyn S, PA-C       Current Outpatient Medications  Medication Sig Dispense Refill  . ARIPiprazole (ABILIFY) 10  MG tablet Take 10 mg by mouth every evening.    Marland Kitchen DM-Phenylephrine-Acetaminophen (VICKS DAYQUIL COLD & FLU) 10-5-325 MG/15ML LIQD Take 15 mLs by mouth as needed (congestion).    . fluPHENAZine (PROLIXIN) 5 MG tablet Take 5 mg by mouth 2 (two) times daily.    . Oxcarbazepine (TRILEPTAL) 300 MG tablet Take 300 mg by mouth 2 (two) times daily.      Musculoskeletal: Strength & Muscle Tone: within normal limits Gait & Station: normal Patient leans: N/A  Psychiatric Specialty Exam: Physical Exam  Nursing note and vitals reviewed. Constitutional: He is oriented to person, place, and time. He appears well-developed.  HENT:  Head: Normocephalic.  Cardiovascular: Normal rate.  Respiratory: Effort normal.  Neurological: He is alert and oriented to person, place, and time.  Psychiatric: His mood appears anxious. His affect is angry and labile. His speech is rapid and/or pressured and tangential. He is agitated and aggressive. Thought content is paranoid and delusional. Cognition and memory are impaired. He expresses inappropriate judgment.    Review of Systems  Constitutional: Negative.   HENT: Negative.   Eyes: Negative.   Respiratory: Negative.   Cardiovascular: Negative.   Gastrointestinal: Negative.   Genitourinary: Negative.   Musculoskeletal: Negative.   Skin: Negative.   Neurological: Negative.   Psychiatric/Behavioral: Positive for agitation, behavioral problems and hallucinations.    Blood pressure 123/80, pulse 73, temperature 97.8 F (36.6 C), temperature source Oral, resp. rate 18,  height 6' 3.5" (1.918 m), weight (!) 139.7 kg, SpO2 93 %.Body mass index is 37.99 kg/m.  General Appearance: Casual and Fairly Groomed  Eye Contact:  Good  Speech:  Pressured  Volume:  Increased  Mood:  Anxious and Irritable  Affect:  Labile  Thought Process:  Coherent, Goal Directed and Descriptions of Associations: Tangential  Orientation:  Full (Time, Place, and Person)  Thought Content:   Delusions, Paranoid Ideation and Tangential  Suicidal Thoughts:  No  Homicidal Thoughts:  Yes.  without intent/plan  Memory:  Immediate;   Good Recent;   Good Remote;   Good  Judgement:  Impaired  Insight:  Lacking  Psychomotor Activity:  Normal  Concentration:  Concentration: Poor and Attention Span: Poor  Recall:  Good  Fund of Knowledge:  Good  Language:  Good  Akathisia:  No  Handed:  Right  AIMS (if indicated):     Assets:  Communication Skills Desire for Improvement Financial Resources/Insurance Housing Intimacy Leisure Time Physical Health Resilience Social Support  ADL's:  Intact  Cognition:  WNL  Sleep:        Treatment Plan Summary: Daily contact with patient to assess and evaluate symptoms and progress in treatment  Disposition: Recommend psychiatric Inpatient admission when medically cleared. Supportive therapy provided about ongoing stressors. Discussed crisis plan, support from social network, calling 911, coming to the Emergency Department, and calling Suicide Hotline.  This service was provided via telemedicine using a 2-way, interactive audio and video technology.  Names of all persons participating in this telemedicine service and their role in this encounter. Name: Kieth Hartis Role: Patient  Name: Berneice Heinrich Role: FNP    Patrcia Dolly, FNP 11/09/2019 1:08 PM

## 2019-11-09 NOTE — ED Notes (Signed)
Pt calm, cooperative, no s/s of distress.  

## 2019-11-09 NOTE — BH Assessment (Addendum)
BHH Assessment Progress Note  As of this morning, this writer continued to seek placement for this pt.  At 13:44 I received a call from [place] at [facility].  She reports that pt has been accepted to their facility by Dr Loyola Mast to the [place] campus.  This is contingent upon pt arriving with his CPAP machine.  She requests that report be called to [phone].  At 13:46 I called pt's parents/legal guardians.  They report that pt had been admitted [facility] in the past, and they were displeased with the outcome.  They would prefer for pt to be released from IVC and discharged to the residence where he lives independently with home health support.  They report that they have made arrangements for enhanced home health on a 24/7 basis.  This was staffed with Nelly Rout, MD.  She agrees to the parents' terms, and has released pt from IVC.  Pt is to be discharged from [facility] with recommendation to continue treatment with Tamela Oddi, PA at [facility].  This has been included in pt's discharge instructions.  At 14:36 I called back to pt's parents to notify them of final disposition.  They report that pt should have the material means to arrange for transportation home via a ride sharing service.  At 15:18 I called back to [facility], notifying them that pt no longer needs their bed.  Pt's nurse, Waynetta Sandy, has been notified.  Doylene Canning, [place] Behavioral Health Coordinator [phone]   Addendum:  Pt has signed Consent to Release Information to Tamela Oddi, PA, pt's outpatient provider.  At 15:57 I called [facility].  Call rolled to voice mail and I left a notification message.  Doylene Canning, [place] Behavioral Health Coordinator [phone]

## 2019-11-09 NOTE — ED Notes (Signed)
Pt resting comfortably

## 2019-11-09 NOTE — Discharge Instructions (Signed)
For your behavioral health needs, you are advised to continue treatment with Tamela Oddi, PA:       [facility]      [address], [address]      [place], [place] [address]      [phone]

## 2019-11-09 NOTE — ED Notes (Signed)
Pt DCd off the unit to home per provider. Pt alert, calm, cooperative, no s/s of distress. DC information given to and reviewed with pt, pt acknowledged understanding. Pt ambulatory off the unit, escorted by RN. Pt using uber for transportation.

## 2019-11-21 ENCOUNTER — Ambulatory Visit: Payer: BC Managed Care – PPO | Admitting: Orthopaedic Surgery

## 2020-11-19 ENCOUNTER — Other Ambulatory Visit: Payer: Self-pay

## 2020-11-19 ENCOUNTER — Ambulatory Visit (HOSPITAL_COMMUNITY)
Admission: EM | Admit: 2020-11-19 | Discharge: 2020-11-21 | Disposition: A | Discharge status: Other Acute Inpatient Hospital | Payer: BC Managed Care – PPO | Attending: Psychiatry | Admitting: Psychiatry

## 2020-11-19 DIAGNOSIS — F250 Schizoaffective disorder, bipolar type: Secondary | ICD-10-CM

## 2020-11-19 DIAGNOSIS — Z20822 Contact with and (suspected) exposure to covid-19: Secondary | ICD-10-CM | POA: Insufficient documentation

## 2020-11-19 DIAGNOSIS — Z87820 Personal history of traumatic brain injury: Secondary | ICD-10-CM | POA: Insufficient documentation

## 2020-11-19 LAB — CBC WITH DIFFERENTIAL/PLATELET
Abs Immature Granulocytes: 0.06 10*3/uL (ref 0.00–0.07)
Basophils Absolute: 0.1 10*3/uL (ref 0.0–0.1)
Basophils Relative: 1 %
Eosinophils Absolute: 0.3 10*3/uL (ref 0.0–0.5)
Eosinophils Relative: 2 %
HCT: 48.7 % (ref 39.0–52.0)
Hemoglobin: 16.2 g/dL (ref 13.0–17.0)
Immature Granulocytes: 1 %
Lymphocytes Relative: 18 %
Lymphs Abs: 2.4 10*3/uL (ref 0.7–4.0)
MCH: 29.4 pg (ref 26.0–34.0)
MCHC: 33.3 g/dL (ref 30.0–36.0)
MCV: 88.4 fL (ref 80.0–100.0)
Monocytes Absolute: 0.7 10*3/uL (ref 0.1–1.0)
Monocytes Relative: 6 %
Neutro Abs: 9.5 10*3/uL — ABNORMAL HIGH (ref 1.7–7.7)
Neutrophils Relative %: 72 %
Platelets: 248 10*3/uL (ref 150–400)
RBC: 5.51 MIL/uL (ref 4.22–5.81)
RDW: 12.4 % (ref 11.5–15.5)
WBC: 13.1 10*3/uL — ABNORMAL HIGH (ref 4.0–10.5)
nRBC: 0 % (ref 0.0–0.2)

## 2020-11-19 LAB — RESP PANEL BY RT-PCR (FLU A&B, COVID) ARPGX2
Influenza A by PCR: NEGATIVE
Influenza B by PCR: NEGATIVE
SARS Coronavirus 2 by RT PCR: NEGATIVE

## 2020-11-19 LAB — POCT URINE DRUG SCREEN - MANUAL ENTRY (I-SCREEN)
POC Amphetamine UR: NOT DETECTED
POC Buprenorphine (BUP): NOT DETECTED
POC Cocaine UR: NOT DETECTED
POC Marijuana UR: POSITIVE — AB
POC Methadone UR: NOT DETECTED
POC Methamphetamine UR: NOT DETECTED
POC Morphine: NOT DETECTED
POC Oxazepam (BZO): NOT DETECTED
POC Oxycodone UR: NOT DETECTED
POC Secobarbital (BAR): NOT DETECTED

## 2020-11-19 LAB — LIPID PANEL
Cholesterol: 160 mg/dL (ref 0–200)
HDL: 34 mg/dL — ABNORMAL LOW (ref >40)
LDL Cholesterol: 72 mg/dL (ref 0–99)
Total CHOL/HDL Ratio: 4.7 RATIO
Triglycerides: 270 mg/dL — ABNORMAL HIGH (ref <150)
VLDL: 54 mg/dL — ABNORMAL HIGH (ref 0–40)

## 2020-11-19 LAB — COMPREHENSIVE METABOLIC PANEL
ALT: 34 U/L (ref 0–44)
AST: 36 U/L (ref 15–41)
Albumin: 4.4 g/dL (ref 3.5–5.0)
Alkaline Phosphatase: 73 U/L (ref 38–126)
Anion gap: 10 (ref 5–15)
BUN: 10 mg/dL (ref 6–20)
CO2: 23 mmol/L (ref 22–32)
Calcium: 9.9 mg/dL (ref 8.9–10.3)
Chloride: 101 mmol/L (ref 98–111)
Creatinine, Ser: 0.66 mg/dL (ref 0.61–1.24)
GFR, Estimated: >60 mL/min (ref >60)
Glucose, Bld: 85 mg/dL (ref 70–99)
Potassium: 4.6 mmol/L (ref 3.5–5.1)
Sodium: 134 mmol/L — ABNORMAL LOW (ref 135–145)
Total Bilirubin: 0.4 mg/dL (ref 0.3–1.2)
Total Protein: 6.9 g/dL (ref 6.5–8.1)

## 2020-11-19 LAB — POC SARS CORONAVIRUS 2 AG -  ED: SARS Coronavirus 2 Ag: NEGATIVE

## 2020-11-19 LAB — ETHANOL: Alcohol, Ethyl (B): <10 mg/dL (ref <10)

## 2020-11-19 MED ORDER — MAGNESIUM HYDROXIDE 400 MG/5ML PO SUSP: 30.0000 mL | Freq: Every day | ORAL | Status: DC | PRN

## 2020-11-19 MED ORDER — OLANZAPINE 10 MG IM SOLR
10.0000 mg | Freq: Three times a day (TID) | INTRAMUSCULAR | Status: DC | PRN
  Administered 2020-11-20: 10 mg via INTRAMUSCULAR
  Filled 2020-11-19 (×2): qty 10

## 2020-11-19 MED ORDER — RISPERIDONE 2 MG PO TBDP
2.0000 mg | ORAL_TABLET | Freq: Every day | ORAL | Status: DC
  Filled 2020-11-19: qty 1

## 2020-11-19 MED ORDER — OXCARBAZEPINE 300 MG PO TABS
300.0000 mg | ORAL_TABLET | Freq: Two times a day (BID) | ORAL | Status: DC
  Filled 2020-11-19: qty 1

## 2020-11-19 MED ORDER — HYDROXYZINE HCL 25 MG PO TABS
25.0000 mg | ORAL_TABLET | Freq: Three times a day (TID) | ORAL | Status: DC | PRN
  Filled 2020-11-19: qty 1

## 2020-11-19 MED ORDER — TRAZODONE HCL 50 MG PO TABS
50.0000 mg | ORAL_TABLET | Freq: Every evening | ORAL | Status: DC | PRN
  Filled 2020-11-19: qty 1

## 2020-11-19 MED ORDER — ALUM & MAG HYDROXIDE-SIMETH 200-200-20 MG/5ML PO SUSP: 30.0000 mL | ORAL | Status: DC | PRN

## 2020-11-19 MED ORDER — OLANZAPINE 10 MG PO TABS: 10.0000 mg | ORAL_TABLET | Freq: Three times a day (TID) | ORAL | Status: DC | PRN

## 2020-11-19 NOTE — ED Notes (Signed)
Given saltines and peanut butter

## 2020-11-19 NOTE — ED Notes (Signed)
Pt A&O x 4, resting at present, no distress noted, calm & cooperative, monitoring for safety. 

## 2020-11-19 NOTE — ED Notes (Addendum)
Pt refused all medication stating, "I'm here against my will, my mom put me here, I don't need medicine, I'll just sleep it off." Pt appears agitated and is requesting to speak to the doctor. Marie, RN/AC notified and in to speak with pt.
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
## 2020-11-19 NOTE — ED Notes (Signed)
[address] [address] 

## 2020-11-19 NOTE — Progress Notes (Signed)
Pt is admitted to OBS bed 4. Pt is ambulatory and is oriented to unit and staff, Pt denied pain, SI, HI and AVH at this time. Pt is labile and agitated. Pt threatened to "tear this place up" if he does not get to leave tonight to get food. Available food choices were made known to pt but he refused and cussed staff. Pt is currently resting. No acute distress noted. Staff will monitor for pt's safety.

## 2020-11-20 LAB — HEMOGLOBIN A1C
Hgb A1c MFr Bld: 5.5 % (ref 4.8–5.6)
Mean Plasma Glucose: 111 mg/dL

## 2020-11-20 MED ORDER — LORAZEPAM 2 MG/ML IJ SOLN: 2.0000 mg | Freq: Three times a day (TID) | INTRAMUSCULAR | Status: DC | PRN

## 2020-11-20 MED ORDER — DIPHENHYDRAMINE HCL 50 MG/ML IJ SOLN
50.0000 mg | Freq: Three times a day (TID) | INTRAMUSCULAR | Status: DC | PRN
  Administered 2020-11-20: 50 mg via INTRAMUSCULAR
  Filled 2020-11-20: qty 1

## 2020-11-20 MED ORDER — NICOTINE POLACRILEX 2 MG MT GUM
2.0000 mg | CHEWING_GUM | OROMUCOSAL | Status: DC | PRN
  Administered 2020-11-20 (×2): 2 mg via ORAL
  Filled 2020-11-20 (×3): qty 1
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
## 2020-11-20 NOTE — Progress Notes (Signed)
[facility]  Meets inpatient criteria per, Earlene Plater, MD   Dr. Loyola Mast is the attending provider.    Call report to [phone]   Marland Mcalpine, LPN @ [facility] notified via secure chat .     Pt is IVC .    Pt may be transported by [facility]   Pt scheduled  to arrive at [facility] after 0800 AM on 10/21/20.  Signed:  Corky Crafts, MSW, [place], LCASA 11/20/2020 12:04 PM

## 2020-11-20 NOTE — ED Notes (Signed)
Pt asleep in bed. Respirations even and unlabored. Will continue to monitor for safety. ?

## 2020-11-20 NOTE — Progress Notes (Signed)
Pt. meets criteria for inpatient treatment per Earlene Plater, MD . Referred out to the following hospitals:   Destination  Service Provider Address Phone Fax Patient Preferred  [facility]  [address] [place], [place] [address] [phone] [facility]  [address] [place], [place] [address] [phone] -  [facility]  [phone] [place] [address] [phone] [facility]  [address] [place], [place] [address] [phone] -  [facility]  [address] [place] [address] [phone] [facility]  [address], [place] [address] [phone] [facility]  [address], [place] [address] [phone] [facility]  [address] [place] [address] [phone] [facility]  [address] [place], [place] [address] [phone] -  [facility]  [address] [place] [address] [phone] [facility]  [address], [place] [address] [phone] -  [facility]  [address] [place] [address] [phone] [facility]  [address] [place] [address] [phone] -     Disposition CSW will continue to follow for placement    Signed:  Corky Crafts, MSW, [place], LCASA 11/20/2020 11:18 AM
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
## 2020-11-20 NOTE — ED Notes (Signed)
Pt took all of his trash which consist of cups and juice cups and threw them behind the nurses station

## 2020-11-20 NOTE — ED Notes (Signed)
Pt resting with eyes closed. Rise and fall of chest noted. No new issues at this time. Will continue to monitor for safety 

## 2020-11-20 NOTE — ED Notes (Signed)
Pt came to nursing station to get some crackers. He has been calm and cooperative. Will continue to monitor for aggressive behavior and outbursts . Will continue to monitor for safety

## 2020-11-20 NOTE — ED Provider Notes (Signed)
Dr. Betti Cruz (outpatient MD)  [facility] and requested call back [phone]  Called at 5:07 PM  Says Daniel Meza is schizophrenic and that he can get aggressive. Was able to get invega sustenna- 156 monthly, due on march 1.  He decompensated in spite of this and recommends supplementing with risperdal. Would like this information to be provided to [facility].

## 2020-11-20 NOTE — ED Provider Notes (Addendum)
Behavioral Health Progress Note  Date and Time: 11/20/2020 1:52 PM Name: Daniel Meza MRN:  [id number]  Subjective:   Chart reviewed. Patient has been refusing medications, no PRNs needed for agitation over the last 24 hours. Per staff, patient was irritable, agitated yesterday but was able to be redirected. Patient remains irritable, delusional, disorganized today. Pt continues to report that he does not need to be in the hospital. He states that his parents field the IVC because "they told the police I pooped in the shower" and then goes on a tangent about his parents being human traffickers. He states that when the police took him to the hospital that they "made me leave my door unlocked" and expresses paranoia about the police going through his apartment and belongings. Denies HI/AVH.   Patient remains acutely psychotic and continues to meet inpatient criteria  Patients parents arrived at the [facility] requesting to speak to physician. They state that he sees Dr. Betti Cruz and that he received the first two doses of invega LAI recently (initiation and booster), next dose is due march 1. State that Dr. Betti Cruz suggested that he take oral risperdal at this time as LAIs take effect. They state that he also has a case manager whose information is present on the IVC. Parents state that when he is doing well he is calm, self de-escalates, and asks for help when he needs it and is able to function well enough to live alone. Delusional thought content is something that has emerged relatively recently. Would like to be kept up to date on patient's placement.   Diagnosis:  Final diagnoses:  Schizoaffective disorder, bipolar type (HCC)    Total Time spent with patient: 20 minutes  Past Psychiatric History: see H&P Past Medical History:  Past Medical History:  Diagnosis Date  . Sleep apnea    uses CPAP  . TBI (traumatic brain injury) (HCC)    No past surgical history on file. Family History: No family history  on file. Family Psychiatric  History: see H&P Social History:  Social History   Substance and Sexual Activity  Alcohol Use Yes   Comment: Not sure     Social History   Substance and Sexual Activity  Drug Use Not on file    Social History   Socioeconomic History  . Marital status: Single    Spouse name: Not on file  . Number of children: Not on file  . Years of education: Not on file  . Highest education level: Not on file  Occupational History  . Occupation: Disability  Tobacco Use  . Smoking status: Not on file  . Smokeless tobacco: Not on file  Substance and Sexual Activity  . Alcohol use: Yes    Comment: Not sure  . Drug use: Not on file  . Sexual activity: Never  Other Topics Concern  . Not on file  Social History Narrative   Pt lives in a home owned by his parents, Temple and Peetz.  Pt receives outpatient treatment with Tamela Oddi at [facility].  He is on disability.   Social Determinants of Health   Financial Resource Strain: Not on file  Food Insecurity: Not on file  Transportation Needs: Not on file  Physical Activity: Not on file  Stress: Not on file  Social Connections: Not on file   SDOH:  SDOH Screenings   Alcohol Screen: Not on file  Depression (PPJ0-9): Not on file  Financial Resource Strain: Not on file  Food Insecurity: Not  on file  Housing: Not on file  Physical Activity: Not on file  Social Connections: Not on file  Stress: Not on file  Tobacco Use: Not on file  Transportation Needs: Not on file   Additional Social History:    Pain Medications: See MAR Prescriptions: See MAR Over the Counter: See MAR History of alcohol / drug use?: No history of alcohol / drug abuse                    Sleep: Fair  Appetite:  Fair  Current Medications:  Current Facility-Administered Medications  Medication Dose Route Frequency Provider Last Rate Last Admin  . alum & mag hydroxide-simeth (MAALOX/MYLANTA) 200-200-20 MG/5ML  suspension 30 mL  30 mL Oral Q4H PRN Estella Husk, MD      . hydrOXYzine (ATARAX/VISTARIL) tablet 25 mg  25 mg Oral TID PRN Estella Husk, MD      . magnesium hydroxide (MILK OF MAGNESIA) suspension 30 mL  30 mL Oral Daily PRN Estella Husk, MD      . OLANZapine (ZYPREXA) tablet 10 mg  10 mg Oral Q8H PRN Estella Husk, MD       Or  . OLANZapine (ZYPREXA) injection 10 mg  10 mg Intramuscular Q8H PRN Estella Husk, MD      . Oxcarbazepine (TRILEPTAL) tablet 300 mg  300 mg Oral BID Estella Husk, MD      . risperiDONE (RISPERDAL M-TABS) disintegrating tablet 2 mg  2 mg Oral QHS Estella Husk, MD      . traZODone (DESYREL) tablet 50 mg  50 mg Oral QHS PRN Estella Husk, MD       Current Outpatient Medications  Medication Sig Dispense Refill  . INVEGA SUSTENNA 234 MG/1.5ML SUSY injection Inject 234 mg into the muscle every 30 (thirty) days.    . ARIPiprazole (ABILIFY) 30 MG tablet Take 30 mg by mouth at bedtime. (Patient not taking: No sig reported)    . fluPHENAZine (PROLIXIN) 10 MG tablet Take 10 mg by mouth daily. (Patient not taking: No sig reported)    . Oxcarbazepine (TRILEPTAL) 300 MG tablet Take 900 mg by mouth 2 (two) times daily. (Patient not taking: No sig reported)      Labs  Lab Results:  Admission on 11/19/2020  Component Date Value Ref Range Status  . SARS Coronavirus 2 by RT PCR 11/19/2020 NEGATIVE  NEGATIVE Final   Comment: (NOTE) SARS-CoV-2 target nucleic acids are NOT DETECTED.  The SARS-CoV-2 RNA is generally detectable in upper respiratory specimens during the acute phase of infection. The lowest concentration of SARS-CoV-2 viral copies this assay can detect is 138 copies/mL. A negative result does not preclude SARS-Cov-2 infection and should not be used as the sole basis for treatment or other patient management decisions. A negative result may occur with  improper specimen collection/handling, submission of  specimen other than nasopharyngeal swab, presence of viral mutation(s) within the areas targeted by this assay, and inadequate number of viral copies(<138 copies/mL). A negative result must be combined with clinical observations, patient history, and epidemiological information. The expected result is Negative.  Fact Sheet for Patients:  BloggerCourse.com  Fact Sheet for Healthcare Providers:  SeriousBroker.it  This test is no                          t yet approved or cleared by the [place] and  has been authorized  for detection and/or diagnosis of SARS-CoV-2 by FDA under an Emergency Use Authorization (EUA). This EUA will remain  in effect (meaning this test can be used) for the duration of the COVID-19 declaration under Section 564(b)(1) of the Act, 21 U.S.C.section 360bbb-3(b)(1), unless the authorization is terminated  or revoked sooner.      . Influenza A by PCR 11/19/2020 NEGATIVE  NEGATIVE Final  . Influenza B by PCR 11/19/2020 NEGATIVE  NEGATIVE Final   Comment: (NOTE) The Xpert Xpress SARS-CoV-2/FLU/RSV plus assay is intended as an aid in the diagnosis of influenza from Nasopharyngeal swab specimens and should not be used as a sole basis for treatment. Nasal washings and aspirates are unacceptable for Xpert Xpress SARS-CoV-2/FLU/RSV testing.  Fact Sheet for Patients: BloggerCourse.com  Fact Sheet for Healthcare Providers: SeriousBroker.it  This test is not yet approved or cleared by the [place] FDA and has been authorized for detection and/or diagnosis of SARS-CoV-2 by FDA under an Emergency Use Authorization (EUA). This EUA will remain in effect (meaning this test can be used) for the duration of the COVID-19 declaration under Section 564(b)(1) of the Act, 21 U.S.C. section 360bbb-3(b)(1), unless the authorization is terminated  or revoked.  Performed at [facility], [address] [place], [place] [address]   . WBC 11/19/2020 13.1* 4.0 - 10.5 K/uL Final  . RBC 11/19/2020 5.51  4.22 - 5.81 MIL/uL Final  . Hemoglobin 11/19/2020 16.2  13.0 - 17.0 g/dL Final  . HCT 60/45/4098 48.7  39.0 - 52.0 % Final  . MCV 11/19/2020 88.4  80.0 - 100.0 fL Final  . MCH 11/19/2020 29.4  26.0 - 34.0 pg Final  . MCHC 11/19/2020 33.3  30.0 - 36.0 g/dL Final  . RDW 11/91/4782 12.4  11.5 - 15.5 % Final  . Platelets 11/19/2020 248  150 - 400 K/uL Final  . nRBC 11/19/2020 0.0  0.0 - 0.2 % Final  . Neutrophils Relative % 11/19/2020 72  % Final  . Neutro Abs 11/19/2020 9.5* 1.7 - 7.7 K/uL Final  . Lymphocytes Relative 11/19/2020 18  % Final  . Lymphs Abs 11/19/2020 2.4  0.7 - 4.0 K/uL Final  . Monocytes Relative 11/19/2020 6  % Final  . Monocytes Absolute 11/19/2020 0.7  0.1 - 1.0 K/uL Final  . Eosinophils Relative 11/19/2020 2  % Final  . Eosinophils Absolute 11/19/2020 0.3  0.0 - 0.5 K/uL Final  . Basophils Relative 11/19/2020 1  % Final  . Basophils Absolute 11/19/2020 0.1  0.0 - 0.1 K/uL Final  . Immature Granulocytes 11/19/2020 1  % Final  . Abs Immature Granulocytes 11/19/2020 0.06  0.00 - 0.07 K/uL Final   Performed at [facility], [address], [place], [place] [address]  . Sodium 11/19/2020 134* 135 - 145 mmol/L Final  . Potassium 11/19/2020 4.6  3.5 - 5.1 mmol/L Final  . Chloride 11/19/2020 101  98 - 111 mmol/L Final  . CO2 11/19/2020 23  22 - 32 mmol/L Final  . Glucose, Bld 11/19/2020 85  70 - 99 mg/dL Final   Glucose reference range applies only to samples taken after fasting for at least 8 hours.  . BUN 11/19/2020 10  6 - 20 mg/dL Final  . Creatinine, Ser 11/19/2020 0.66  0.61 - 1.24 mg/dL Final  . Calcium 30/86/5784 9.9  8.9 - 10.3 mg/dL Final  . Total Protein 11/19/2020 6.9  6.5 - 8.1 g/dL Final  . Albumin 69/62/9528 4.4  3.5 - 5.0 g/dL  Final  . AST 11/19/2020 36  15 - 41 U/L Final  . ALT  11/19/2020 34  0 - 44 U/L Final  . Alkaline Phosphatase 11/19/2020 73  38 - 126 U/L Final  . Total Bilirubin 11/19/2020 0.4  0.3 - 1.2 mg/dL Final  . GFR, Estimated 11/19/2020 >60  >60 mL/min Final   Comment: (NOTE) Calculated using the CKD-EPI Creatinine Equation (2021)   . Anion gap 11/19/2020 10  5 - 15 Final   Performed at [facility], [address] [place], [place] [address]  . Hgb A1c MFr Bld 11/19/2020 5.5  4.8 - 5.6 % Final   Comment: (NOTE)         Prediabetes: 5.7 - 6.4         Diabetes: >6.4         Glycemic control for adults with diabetes: <7.0   . Mean Plasma Glucose 11/19/2020 111  mg/dL Final   Comment: (NOTE) Performed At: [facility] [address] [place], [place] [id number] Jolene Schimke MD ER:[id number]   . Alcohol, Ethyl (B) 11/19/2020 <10  <10 mg/dL Final   Comment: (NOTE) Lowest detectable limit for serum alcohol is 10 mg/dL.  For medical purposes only. Performed at [facility], [address], [place], [place] [address]   . Cholesterol 11/19/2020 160  0 - 200 mg/dL Final  . Triglycerides 11/19/2020 270* <150 mg/dL Final  . HDL 97/11/6376 34* >40 mg/dL Final  . Total CHOL/HDL Ratio 11/19/2020 4.7  RATIO Final  . VLDL 11/19/2020 54* 0 - 40 mg/dL Final  . LDL Cholesterol 11/19/2020 72  0 - 99 mg/dL Final   Comment:        Total Cholesterol/HDL:CHD Risk Coronary Heart Disease Risk Table                     Men   Women  1/2 Average Risk   3.4   3.3  Average Risk       5.0   4.4  2 X Average Risk   9.6   7.1  3 X Average Risk  23.4   11.0        Use the calculated Patient Ratio above and the CHD Risk Table to determine the patient's CHD Risk.        ATP III CLASSIFICATION (LDL):  <100     mg/dL   Optimal  588-502  mg/dL   Near or Above                    Optimal  130-159  mg/dL   Borderline  774-128  mg/dL   High  >786     mg/dL   Very High Performed at [facility], [address], [place], [place] [address]    . POC Amphetamine UR 11/19/2020 None Detected  NONE DETECTED (Cut Off Level 1000 ng/mL) Final  . POC Secobarbital (BAR) 11/19/2020 None Detected  NONE DETECTED (Cut Off Level 300 ng/mL) Final  . POC Buprenorphine (BUP) 11/19/2020 None Detected  NONE DETECTED (Cut Off Level 10 ng/mL) Final  . POC Oxazepam (BZO) 11/19/2020 None Detected  NONE DETECTED (Cut Off Level 300 ng/mL) Final  . POC Cocaine UR 11/19/2020 None Detected  NONE DETECTED (Cut Off Level 300 ng/mL) Final  . POC Methamphetamine UR 11/19/2020 None Detected  NONE DETECTED (Cut Off Level 1000 ng/mL) Final  . POC Morphine 11/19/2020 None Detected  NONE DETECTED (Cut Off Level 300  ng/mL) Final  . POC Oxycodone UR 11/19/2020 None Detected  NONE DETECTED (Cut Off Level 100 ng/mL) Final  . POC Methadone UR 11/19/2020 None Detected  NONE DETECTED (Cut Off Level 300 ng/mL) Final  . POC Marijuana UR 11/19/2020 Positive* NONE DETECTED (Cut Off Level 50 ng/mL) Final  . SARS Coronavirus 2 Ag 11/19/2020 Negative  Negative Final    Blood Alcohol level:  Lab Results  Component Value Date   ETH <10 11/19/2020   ETH <10 11/08/2019    Metabolic Disorder Labs: Lab Results  Component Value Date   HGBA1C 5.5 11/19/2020   MPG 111 11/19/2020   No results found for: PROLACTIN Lab Results  Component Value Date   CHOL 160 11/19/2020   TRIG 270 (H) 11/19/2020   HDL 34 (L) 11/19/2020   CHOLHDL 4.7 11/19/2020   VLDL 54 (H) 11/19/2020   LDLCALC 72 11/19/2020    Therapeutic Lab Levels: No results found for: LITHIUM No results found for: VALPROATE No components found for:  CBMZ  Physical Findings   [facility]08[facility]2019 in [facility]  C-SSRS RISK CATEGORY No Risk No Risk       Musculoskeletal  Strength & Muscle Tone: within normal limits Gait & Station: normal Patient leans: N/A  Psychiatric Specialty Exam  Presentation   General Appearance: Appropriate for Environment; Disheveled; Casual  Eye Contact:Fair; Fleeting  Speech:Clear and Coherent; Pressured; Other (comment) (irritable tone of voice)  Speech Volume:Other (comment) (normal, increased at times)  Handedness:No data recorded  Mood and Affect  Mood:Angry; Anxious; Irritable  Affect:Congruent; Labile   Thought Process  Thought Processes:Disorganized  Descriptions of Associations:Tangential  Orientation:Full (Time, Place and Person)  Thought Content:Tangential; Delusions  Hallucinations:Hallucinations: None  Ideas of Reference:None  Suicidal Thoughts:Suicidal Thoughts: No  Homicidal Thoughts:Homicidal Thoughts: No   Sensorium  Memory:Immediate Fair; Recent Fair; Remote Fair  Judgment:Poor  Insight:Lacking; Poor   Executive Functions  Concentration:Fair  Attention Span:Fair  Recall:Fair  Fund of Knowledge:Fair  Language:Fair   Psychomotor Activity  Psychomotor Activity:Psychomotor Activity: Normal   Assets  Assets:Social Support; Resilience; Physical Health   Sleep  Sleep:Sleep: Fair   Physical Exam  Physical Exam Constitutional:      Appearance: Normal appearance. He is normal weight.  HENT:     Head: Normocephalic and atraumatic.  Eyes:     Extraocular Movements: Extraocular movements intact.  Pulmonary:     Effort: Pulmonary effort is normal.  Neurological:     Mental Status: He is alert.    Review of Systems  Unable to perform ROS: Psychiatric disorder (acutely psychotic)  Psychiatric/Behavioral: Negative for suicidal ideas.   Blood pressure (!) 139/94, pulse (!) 107, temperature 98.4 F (36.9 C), temperature source Tympanic, SpO2 98 %. There is no height or weight on file to calculate BMI.  Treatment Plan Summary: Daily contact with patient to assess and evaluate symptoms and progress in treatment and Medication management   -due for invega sustenna LAI 12/03/2020 (unk dose-likely 156  mg but may benefit from increased monthly dose) per collateral -per parents takes risperdal at home; unknown dose, will continue 2 mg qhs for now although patient has been refusing medications. -continue trileptal 300 mg BID -trileptal pending-ordered 2/15 -agitation protocol  -patient has been [facility] after 0800 AM on 10/21/20.   Estella HuskKatherine S Sally Reimers, MD 11/20/2020 1:52 PM

## 2020-11-20 NOTE — ED Notes (Signed)
Pt refused medication after several attempts. Pt was agitated immediately stating "I only take a once a month injection and nothing else!"

## 2020-11-20 NOTE — ED Notes (Signed)
Pt refused medications.

## 2020-11-20 NOTE — ED Notes (Signed)
Pt slammed phone up against the wall numerous of times security got involved. He is yelling very loudlyu on the unit and being verbally aggressive towards the supervisor calling her out her name.

## 2020-11-21 NOTE — ED Notes (Signed)
Pt resting with eyes closed. Rise and fall of chest noted. Waiting on law enforcement to arrive to transport to [facility]. Calm and cooperative at this time. Will continue to monitor for safety

## 2020-11-21 NOTE — ED Notes (Signed)
Pt sleeping@this time. Breathing even and unlabored. Will continue to monitor for safety 

## 2020-11-21 NOTE — ED Notes (Signed)
Report called  

## 2020-11-21 NOTE — ED Notes (Signed)
[facility] requested for pt to [facility] after 8am. [phone].  Pt is IVC.
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
## 2020-11-21 NOTE — ED Notes (Signed)
Pt refused medication after several attempts
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
## 2020-11-21 NOTE — Discharge Instructions (Signed)
Transfer to [facility] 

## 2020-11-22 LAB — 10-HYDROXYCARBAZEPINE: Triliptal/MTB(Oxcarbazepin): <1 ug/mL — ABNORMAL LOW (ref 10–35)
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
## 2023-01-28 ENCOUNTER — Emergency Department (HOSPITAL_COMMUNITY)
Admission: EM | Admit: 2023-01-28 | Discharge: 2023-01-29 | Disposition: A | Discharge status: Home / Self Care | Payer: BC Managed Care – PPO | Attending: Student | Admitting: Student

## 2023-01-28 ENCOUNTER — Emergency Department (HOSPITAL_COMMUNITY): Payer: BC Managed Care – PPO

## 2023-01-28 ENCOUNTER — Other Ambulatory Visit: Payer: Self-pay

## 2023-01-28 ENCOUNTER — Encounter (HOSPITAL_COMMUNITY): Payer: Self-pay | Admitting: *Deleted

## 2023-01-28 DIAGNOSIS — W19XXXA Unspecified fall, initial encounter: Secondary | ICD-10-CM

## 2023-01-28 DIAGNOSIS — S0993XA Unspecified injury of face, initial encounter: Secondary | ICD-10-CM | POA: Diagnosis present

## 2023-01-28 DIAGNOSIS — Z23 Encounter for immunization: Secondary | ICD-10-CM | POA: Insufficient documentation

## 2023-01-28 DIAGNOSIS — S81811A Laceration without foreign body, right lower leg, initial encounter: Secondary | ICD-10-CM

## 2023-01-28 DIAGNOSIS — S01112A Laceration without foreign body of left eyelid and periocular area, initial encounter: Secondary | ICD-10-CM | POA: Diagnosis not present

## 2023-01-28 DIAGNOSIS — M25562 Pain in left knee: Secondary | ICD-10-CM | POA: Insufficient documentation

## 2023-01-28 DIAGNOSIS — Y92410 Unspecified street and highway as the place of occurrence of the external cause: Secondary | ICD-10-CM | POA: Diagnosis not present

## 2023-01-28 DIAGNOSIS — S0990XA Unspecified injury of head, initial encounter: Secondary | ICD-10-CM | POA: Insufficient documentation

## 2023-01-28 DIAGNOSIS — W01198A Fall on same level from slipping, tripping and stumbling with subsequent striking against other object, initial encounter: Secondary | ICD-10-CM | POA: Diagnosis not present

## 2023-01-28 MED ORDER — TETANUS-DIPHTH-ACELL PERTUSSIS 5-2.5-18.5 LF-MCG/0.5 IM SUSY
0.5000 mL | PREFILLED_SYRINGE | Freq: Once | INTRAMUSCULAR | Status: AC
  Administered 2023-01-28: 0.5 mL via INTRAMUSCULAR
  Filled 2023-01-28: qty 0.5

## 2023-01-28 MED ORDER — ACETAMINOPHEN 325 MG PO TABS
650.0000 mg | ORAL_TABLET | Freq: Four times a day (QID) | ORAL | Status: DC | PRN
  Administered 2023-01-28: 650 mg via ORAL
  Filled 2023-01-28: qty 2
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
## 2023-01-29 DIAGNOSIS — S01112A Laceration without foreign body of left eyelid and periocular area, initial encounter: Secondary | ICD-10-CM | POA: Diagnosis not present

## 2023-01-29 LAB — CBC WITH DIFFERENTIAL/PLATELET
Abs Immature Granulocytes: 0.08 10*3/uL — ABNORMAL HIGH (ref 0.00–0.07)
Basophils Absolute: 0.1 10*3/uL (ref 0.0–0.1)
Basophils Relative: 1 %
Eosinophils Absolute: 0.2 10*3/uL (ref 0.0–0.5)
Eosinophils Relative: 2 %
HCT: 36.2 % — ABNORMAL LOW (ref 39.0–52.0)
Hemoglobin: 12.4 g/dL — ABNORMAL LOW (ref 13.0–17.0)
Immature Granulocytes: 1 %
Lymphocytes Relative: 15 %
Lymphs Abs: 1.3 10*3/uL (ref 0.7–4.0)
MCH: 29.5 pg (ref 26.0–34.0)
MCHC: 34.3 g/dL (ref 30.0–36.0)
MCV: 86 fL (ref 80.0–100.0)
Monocytes Absolute: 0.5 10*3/uL (ref 0.1–1.0)
Monocytes Relative: 6 %
Neutro Abs: 6.4 10*3/uL (ref 1.7–7.7)
Neutrophils Relative %: 75 %
Platelets: 240 10*3/uL (ref 150–400)
RBC: 4.21 MIL/uL — ABNORMAL LOW (ref 4.22–5.81)
RDW: 13.1 % (ref 11.5–15.5)
WBC: 8.5 10*3/uL (ref 4.0–10.5)
nRBC: 0 % (ref 0.0–0.2)

## 2023-01-29 LAB — COMPREHENSIVE METABOLIC PANEL
ALT: 39 U/L (ref 0–44)
AST: 58 U/L — ABNORMAL HIGH (ref 15–41)
Albumin: 4.2 g/dL (ref 3.5–5.0)
Alkaline Phosphatase: 79 U/L (ref 38–126)
Anion gap: 11 (ref 5–15)
BUN: 14 mg/dL (ref 6–20)
CO2: 24 mmol/L (ref 22–32)
Calcium: 9.7 mg/dL (ref 8.9–10.3)
Chloride: 94 mmol/L — ABNORMAL LOW (ref 98–111)
Creatinine, Ser: 0.84 mg/dL (ref 0.61–1.24)
GFR, Estimated: >60 mL/min (ref >60)
Glucose, Bld: 118 mg/dL — ABNORMAL HIGH (ref 70–99)
Potassium: 3.8 mmol/L (ref 3.5–5.1)
Sodium: 129 mmol/L — ABNORMAL LOW (ref 135–145)
Total Bilirubin: 0.3 mg/dL (ref 0.3–1.2)
Total Protein: 6.6 g/dL (ref 6.5–8.1)

## 2023-01-29 MED ORDER — DOXYCYCLINE HYCLATE 100 MG PO CAPS: 100.0000 mg | ORAL_CAPSULE | Freq: Two times a day (BID) | ORAL | 0 refills | Status: AC

## 2023-01-29 MED ORDER — LIDOCAINE HCL (PF) 1 % IJ SOLN
INTRAMUSCULAR | Status: AC
  Filled 2023-01-29: qty 30
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
## 2023-01-29 NOTE — Discharge Instructions (Addendum)
Imaging was reassuring, it is possible that you have a slight concussion, symptoms include a mild headache, brain fog, dizziness, lightheadedness, difficulty concentrating, increase sensitivity to light or noise.  The symptoms will resolve on their own I recommend brain rest i.e. decreasing screen time, vigorous activities and slowly reintroduce them as tolerated.  If your symptoms persist over the weeks time please follow-up with your PCP and/or the concussion clinic for further evaluation you may take over-the-counter pain medication as needed.  Small laceration received 3 sutures in your right leg and 4 sutures over your left eyebrow, please refrain from getting wet for today, starting tomorrow please rinse out the wound and apply new dressings, do this twice daily.  May use over-the-counter pain medication as needed.  To help decrease scarring please stay out of direct sunlight for the first 6 weeks.  I have started on antibiotics please take as prescribed   Follow-up next 5 days for suture removal of the left eyebrow, would like you to follow-up in 10 days time for suture movable of the right leg.  You may go to your primary care doctor, urgent care, this department   If you notice worsening redness swelling discharge or worsening pain in the area please come back in for reassessment

## 2023-01-29 NOTE — ED Notes (Signed)
Pt called mother to have family come pick him up. Family on the way

## 2023-01-29 NOTE — ED Notes (Signed)
C-spine cleared c-collar removed pt resting comfortably in bed vs wnl

## 2023-01-29 NOTE — ED Notes (Signed)
Patient wounds and  abrasions cleaned with soap and water leg laceration sutured cleaned dressed with non stick telfa and ace wrap per provider orders

## 2024-05-16 ENCOUNTER — Other Ambulatory Visit: Payer: Self-pay

## 2024-05-16 ENCOUNTER — Other Ambulatory Visit (HOSPITAL_COMMUNITY): Payer: Self-pay
